# Patient Record
Sex: Male | Born: 1995 | Race: White | Hispanic: No
Health system: Southern US, Community
[De-identification: ages and names within clinical notes are randomized; demographics above are authoritative.]

---
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
## 2019-09-12 ENCOUNTER — Ambulatory Visit: Payer: Self-pay | Attending: Internal Medicine

## 2019-09-12 DIAGNOSIS — Z23 Encounter for immunization: Secondary | ICD-10-CM

## 2019-09-12 NOTE — Progress Notes (Signed)
   Covid-[facility] Vaccination [facility]  Name:  Caleb Gay    MRN: [id number] DOB: 1995/12/25  09/12/2019  [name] was observed post Covid-19 immunization for 15 minutes without incident. He was provided with Vaccine Information Sheet and instruction to access the V-Safe system.   [name] was instructed to call 911 with any severe reactions post vaccine: Marland Kitchen Difficulty breathing  . Swelling of face and throat  . A fast heartbeat  . A bad rash all over body  . Dizziness and weakness   Immunizations Administered    Name Date Dose VIS Date Route   Pfizer COVID-19 Vaccine 09/12/2019 10:32 AM 0.3 mL 05/22/2019 Intramuscular   Manufacturer: [employer], [employer]   Lot: [id number]   NDC: [address]-6

## 2019-10-03 ENCOUNTER — Ambulatory Visit: Payer: Medicaid Other | Attending: Internal Medicine

## 2019-10-03 DIAGNOSIS — Z23 Encounter for immunization: Secondary | ICD-10-CM

## 2019-10-03 NOTE — Progress Notes (Signed)
   Covid-19 Vaccination Clinic  Name:  DORY VERDUN    MRN: [id number] DOB: [birth year]  10/03/2019  Mr. [place] was observed post Covid-19 immunization for 15 minutes without incident. He was provided with Vaccine Information Sheet and instruction to access the V-Safe system.   Mr. [place] was instructed to call 911 with any severe reactions post vaccine: Marland Kitchen Difficulty breathing  . Swelling of face and throat  . A fast heartbeat  . A bad rash all over body  . Dizziness and weakness   Immunizations Administered    Name Date Dose VIS Date Route   Pfizer COVID-19 Vaccine 10/03/2019 10:30 AM 0.3 mL 08/05/2018 Intramuscular   Manufacturer: [employer], [employer]   Lot: [id number]   NDC: [address]-9
